# Patient Record
Sex: Female | Born: 1990 | Race: White | Hispanic: Yes | Marital: Single | Smoking: Never smoker
Health system: Southern US, Community
[De-identification: ages and names within clinical notes are randomized; demographics above are authoritative.]

## PROBLEM LIST (undated history)

## (undated) DIAGNOSIS — Z789 Other specified health status: Secondary | ICD-10-CM

## (undated) HISTORY — PX: NO PAST SURGERIES: SHX2092

---
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
## 2011-01-14 NOTE — Group Therapy Note (Signed)
NAME:  Natasha Gomez, Natasha Gomez NO.:  [id number]   MEDICAL RECORD NO.:  [id number]          PATIENT TYPE:  WOC   LOCATION:  [facility]                   FACILITY:  [facility]   PHYSICIAN:  Caren Griffins, CNM       DATE OF BIRTH:  [birth year]   DATE OF SERVICE:                                  CLINIC NOTE   REASON FOR VISIT:  Followup SAB.   HISTORY:  This is a 20 year old G1 P0-0-1-0 who was being followed in  maternity admissions for anembryonic pregnancy of 6 weeks one day sac  measurement, gestation on August 20.  She did receive Cytotec and  returned to the MAU on August 21 because of heavy bleeding.  At that  time, her hemoglobin was 13.7 and she was reassured.  She does have oral  contraceptives which she plans to start after her next spontaneous  normal period and she has not been sexually active.  She is still having  some pinkish bleeding and uses 4 or many pads a day.  She denies  cramping, denies passing any tissue, or feeling weak or dizzy.   INTERVAL HISTORY:  Since her visit to MAU on August 21, she has  continued to have this bleeding as described above and for this reason  pelvic exam was done.   PHYSICAL EXAMINATION:  VITAL SIGNS:  Pulse 106, BP 95/61, which is  compared to August 21 when her BP was 114/67 and pulse 82.  Her weight  is 125 and height 5 feet 1 inch.  ABDOMEN:  Soft, flat, nontender.  PELVIC:  NEFG.  UTERUS:  NSSP, minimally tender cervix, external loss fingertip,  internal close, long.  There is a scant amount of dark red mucousy blood  present.   ASSESSMENT:  Prolonged scant bleeding status post Cytotec termination 2  weeks ago.   PLAN:  Since gestational age [redacted] weeks, uterus normal size and bleeding is  scant, she will continue to monitor the bleeding and return here in 2  weeks if she still has continuous scanty bleeding.  If she has any heavy  bleeding, she will return at that time and if her bleeding ceases by 2  weeks, she will cancel  that appointment.  We again reviewed oral  contraceptive use, what to do if she miss a pill, and when to start  pills.  She is also advised about condom use to prevent STI's and about  the Gardasil which she had not heard of before.  Brochure was given.           ______________________________  Caren Griffins, CNM     DP/MEDQ  D:  05/03/2009  T:  05/04/2009  Job:  [id number]

## 2011-01-17 NOTE — H&P (Signed)
[name], [name]             ACCOUNT NO.:  [id number]   MEDICAL RECORD NO.:  [id number]          PATIENT TYPE:  INP   LOCATION:  9163                          FACILITY:  [facility]   PHYSICIAN:  Roseanna Rainbow, M.D.DATE OF BIRTH:  August 22, 1991   DATE OF ADMISSION:  12/25/2006  DATE OF DISCHARGE:                              HISTORY & PHYSICAL   CHIEF COMPLAINT:  The patient is a 20 year old gravida 1, para 0 with an  estimated date of confinement of April 20 with an intrauterine pregnancy  of 40 and 5/7 weeks for induction of labor secondary to a nonreactive  nonstress test.   HISTORY OF PRESENT ILLNESS:  Please see the above.   ALLERGIES:  No known drug allergies.   MEDICATIONS:  Prenatal vitamins.   OB RISK FACTORS:  Adolescent.  Urinary tract infection.  Prenatal  screening.  Urine culture and sensitivity on January 7:  E. coli, Pen  sensitive, negative test of cure on February 4.  Chlamydia probe  negative.  CG probe negative.  Hepatitis B surface antigen negative.  Pap smear negative.  Hematocrit 38.9, hemoglobin 13.3.  HIV nonreactive.  Platelets 210,000.  Blood type is O positive.  Antibody screen negative.  RPR nonreactive.  Rubella immune. Sickle cell negative.  Varicella  immune.   PAST GYN HISTORY:  Noncontributory.   PAST MEDICAL HISTORY:  No significant history of medical diseases.   PAST SURGICAL HISTORY:  No previous surgery.   SOCIAL HISTORY:  Does not give any significant history of alcohol usage.  Has no significant smoking history.  Denies illicit drug use.   FAMILY HISTORY:  Hypertension.   PHYSICAL EXAMINATION:  Vital signs:  Blood pressure 112/80, temperature  98, heart rate 96, respirations 20.  Fetal heart tracing reassuring.  Tocodynamometer contractions every 2-5 minutes.  Sterile vaginal exam.  Cervix is 3-4 cm dilated, 80% effaced.  The vertex at a 0 station.  The  membranes were artificially ruptured for clear fluid.   ASSESSMENT:   Primigravida at term, latent labor, fetal heart tracing  consistent with fetal well-being.   PLAN:  Admission, augmentation of labor.      Roseanna Rainbow, M.D.  Electronically Signed     LAJ/MEDQ  D:  12/25/2006  T:  12/25/2006  Job:  [id number]

## 2011-02-28 ENCOUNTER — Inpatient Hospital Stay (HOSPITAL_COMMUNITY)
Admission: AD | Admit: 2011-02-28 | Discharge: 2011-02-28 | Disposition: A | Discharge status: Home / Self Care | Payer: Self-pay | Source: Ambulatory Visit | Attending: Obstetrics & Gynecology | Admitting: Obstetrics & Gynecology

## 2011-02-28 DIAGNOSIS — O99891 Other specified diseases and conditions complicating pregnancy: Secondary | ICD-10-CM | POA: Insufficient documentation

## 2011-03-03 ENCOUNTER — Inpatient Hospital Stay (HOSPITAL_COMMUNITY)
Admission: AD | Admit: 2011-03-03 | Discharge: 2011-03-06 | DRG: 775 | Disposition: A | Discharge status: Home / Self Care | Payer: Medicaid Other | Source: Ambulatory Visit | Attending: Obstetrics & Gynecology | Admitting: Obstetrics & Gynecology

## 2011-03-03 LAB — CBC
Hemoglobin: 14.1 g/dL (ref 12.0–15.0)
MCH: 31.8 pg (ref 26.0–34.0)
RBC: 4.43 MIL/uL (ref 3.87–5.11)

## 2011-03-04 LAB — RPR: RPR Ser Ql: NONREACTIVE

## 2011-03-05 LAB — CBC
HCT: 34.6 % — ABNORMAL LOW (ref 36.0–46.0)
Hemoglobin: 11.6 g/dL — ABNORMAL LOW (ref 12.0–15.0)
MCHC: 33.5 g/dL (ref 30.0–36.0)
WBC: 11.5 10*3/uL — ABNORMAL HIGH (ref 4.0–10.5)

## 2011-05-27 ENCOUNTER — Emergency Department (HOSPITAL_COMMUNITY)
Admission: EM | Admit: 2011-05-27 | Discharge: 2011-05-27 | Disposition: A | Discharge status: Home / Self Care | Payer: Self-pay | Attending: Emergency Medicine | Admitting: Emergency Medicine

## 2011-05-27 ENCOUNTER — Emergency Department (HOSPITAL_COMMUNITY): Payer: Self-pay

## 2011-05-27 DIAGNOSIS — R1013 Epigastric pain: Secondary | ICD-10-CM | POA: Insufficient documentation

## 2011-05-27 DIAGNOSIS — K219 Gastro-esophageal reflux disease without esophagitis: Secondary | ICD-10-CM | POA: Insufficient documentation

## 2011-05-27 LAB — COMPREHENSIVE METABOLIC PANEL
ALT: 29 U/L (ref 0–35)
AST: 41 U/L — ABNORMAL HIGH (ref 0–37)
Albumin: 3.9 g/dL (ref 3.5–5.2)
Alkaline Phosphatase: 72 U/L (ref 39–117)
CO2: 26 mEq/L (ref 19–32)
Chloride: 106 mEq/L (ref 96–112)
GFR calc non Af Amer: >60 mL/min (ref >60)
Potassium: 3.7 mEq/L (ref 3.5–5.1)
Sodium: 140 mEq/L (ref 135–145)
Total Bilirubin: 0.4 mg/dL (ref 0.3–1.2)

## 2011-05-27 LAB — CBC
MCV: 89.7 fL (ref 78.0–100.0)
Platelets: 314 10*3/uL (ref 150–400)
RBC: 4.18 MIL/uL (ref 3.87–5.11)
RDW: 13.8 % (ref 11.5–15.5)
WBC: 11.4 10*3/uL — ABNORMAL HIGH (ref 4.0–10.5)

## 2011-05-27 LAB — URINE MICROSCOPIC-ADD ON

## 2011-05-27 LAB — DIFFERENTIAL
Basophils Absolute: 0 10*3/uL (ref 0.0–0.1)
Basophils Relative: 0 % (ref 0–1)
Eosinophils Absolute: 0.1 10*3/uL (ref 0.0–0.7)
Lymphs Abs: 2.4 10*3/uL (ref 0.7–4.0)
Neutrophils Relative %: 72 % (ref 43–77)

## 2011-05-27 LAB — URINALYSIS, ROUTINE W REFLEX MICROSCOPIC
Glucose, UA: NEGATIVE mg/dL
pH: 6 (ref 5.0–8.0)

## 2014-08-05 LAB — OB RESULTS CONSOLE RUBELLA ANTIBODY, IGM: Rubella: IMMUNE

## 2014-08-05 LAB — OB RESULTS CONSOLE GC/CHLAMYDIA
Chlamydia: NEGATIVE
Gonorrhea: NEGATIVE

## 2014-08-05 LAB — OB RESULTS CONSOLE HIV ANTIBODY (ROUTINE TESTING): HIV: NONREACTIVE

## 2014-08-05 LAB — OB RESULTS CONSOLE HEPATITIS B SURFACE ANTIGEN: Hepatitis B Surface Ag: NEGATIVE

## 2014-08-05 LAB — OB RESULTS CONSOLE RPR: RPR: NONREACTIVE

## 2014-08-07 ENCOUNTER — Other Ambulatory Visit (HOSPITAL_COMMUNITY): Payer: Self-pay | Admitting: Physician Assistant

## 2014-08-07 DIAGNOSIS — Z3689 Encounter for other specified antenatal screening: Secondary | ICD-10-CM

## 2014-09-01 NOTE — L&D Delivery Note (Signed)
Delivery Note Moderate MSF noted during second stage[facility] vigorously immediately after birth. Mouth and nose bulb suctioned. At 6:16 PM a viable female was delivered via Vaginal, Spontaneous Delivery (Presentation: Left Occiput Anterior).  APGAR: 9, 9; weight pending.    Placenta status: Intact, Spontaneous.  Cord:  with the following complications: None.  Cord pH: NA  Anesthesia: None  Episiotomy: None Lacerations: None Suture Repair: none Est. Blood Loss (mL): 425  Mom to postpartum.  Baby to Couplet care / Skin to Skin. Placenta to: BS Feeding: Breast Circ: NA Contraception: POP's  Natasha Gomez 01/30/2015, 7:30 PM

## 2014-09-11 ENCOUNTER — Ambulatory Visit (HOSPITAL_COMMUNITY)
Admission: RE | Admit: 2014-09-11 | Discharge: 2014-09-11 | Disposition: A | Discharge status: Home / Self Care | Payer: Medicaid Other | Source: Ambulatory Visit | Attending: Physician Assistant | Admitting: Physician Assistant

## 2014-09-11 ENCOUNTER — Encounter (HOSPITAL_COMMUNITY): Payer: Self-pay

## 2014-09-11 DIAGNOSIS — Z3689 Encounter for other specified antenatal screening: Secondary | ICD-10-CM | POA: Insufficient documentation

## 2014-09-11 DIAGNOSIS — Z3A19 19 weeks gestation of pregnancy: Secondary | ICD-10-CM | POA: Insufficient documentation

## 2014-09-11 DIAGNOSIS — Z36 Encounter for antenatal screening of mother: Secondary | ICD-10-CM | POA: Insufficient documentation

## 2014-10-20 ENCOUNTER — Other Ambulatory Visit (HOSPITAL_COMMUNITY): Payer: Self-pay | Admitting: Nurse Practitioner

## 2014-10-20 DIAGNOSIS — Z0372 Encounter for suspected placental problem ruled out: Secondary | ICD-10-CM

## 2014-10-20 DIAGNOSIS — Z0374 Encounter for suspected problem with fetal growth ruled out: Secondary | ICD-10-CM

## 2014-10-25 ENCOUNTER — Ambulatory Visit (HOSPITAL_COMMUNITY)
Admission: RE | Admit: 2014-10-25 | Discharge: 2014-10-25 | Disposition: A | Discharge status: Home / Self Care | Payer: Medicaid Other | Source: Ambulatory Visit | Attending: Nurse Practitioner | Admitting: Nurse Practitioner

## 2014-10-25 DIAGNOSIS — Z0374 Encounter for suspected problem with fetal growth ruled out: Secondary | ICD-10-CM | POA: Insufficient documentation

## 2014-10-25 DIAGNOSIS — Z0372 Encounter for suspected placental problem ruled out: Secondary | ICD-10-CM

## 2014-10-25 DIAGNOSIS — O43892 Other placental disorders, second trimester: Secondary | ICD-10-CM | POA: Insufficient documentation

## 2014-10-25 DIAGNOSIS — Z3A25 25 weeks gestation of pregnancy: Secondary | ICD-10-CM | POA: Insufficient documentation

## 2015-01-11 LAB — OB RESULTS CONSOLE GBS: STREP GROUP B AG: POSITIVE

## 2015-01-22 ENCOUNTER — Encounter (HOSPITAL_COMMUNITY): Payer: Self-pay

## 2015-01-22 ENCOUNTER — Inpatient Hospital Stay (HOSPITAL_COMMUNITY)
Admission: AD | Admit: 2015-01-22 | Discharge: 2015-01-22 | Disposition: A | Discharge status: Home / Self Care | Payer: Self-pay | Source: Ambulatory Visit | Attending: Obstetrics & Gynecology | Admitting: Obstetrics & Gynecology

## 2015-01-22 DIAGNOSIS — Z3A38 38 weeks gestation of pregnancy: Secondary | ICD-10-CM | POA: Insufficient documentation

## 2015-01-22 HISTORY — DX: Other specified health status: Z78.9

## 2015-01-22 NOTE — Discharge Instructions (Signed)
Fetal Movement Counts °Patient Name: __________________________________________________ Patient Due Date: ____________________ °Performing a fetal movement count is highly recommended in high-risk pregnancies, but it is good for every pregnant woman to do. Your health care provider may ask you to start counting fetal movements at 28 weeks of the pregnancy. Fetal movements often increase: °· After eating a full meal. °· After physical activity. °· After eating or drinking something sweet or cold. °· At rest. °Pay attention to when you feel the baby is most active. This will help you notice a pattern of your baby's sleep and wake cycles and what factors contribute to an increase in fetal movement. It is important to perform a fetal movement count at the same time each day when your baby is normally most active.  °HOW TO COUNT FETAL MOVEMENTS °1. Find a quiet and comfortable area to sit or lie down on your left side. Lying on your left side provides the best blood and oxygen circulation to your baby. °2. Write down the day and time on a sheet of paper or in a journal. °3. Start counting kicks, flutters, swishes, rolls, or jabs in a 2-hour period. You should feel at least 10 movements within 2 hours. °4. If you do not feel 10 movements in 2 hours, wait 2-3 hours and count again. Look for a change in the pattern or not enough counts in 2 hours. °SEEK MEDICAL CARE IF: °· You feel less than 10 counts in 2 hours, tried twice. °· There is no movement in over an hour. °· The pattern is changing or taking longer each day to reach 10 counts in 2 hours. °· You feel the baby is not moving as he or she usually does. °Date: ____________ Movements: ____________ Start time: ____________ Finish time: ____________  °Date: ____________ Movements: ____________ Start time: ____________ Finish time: ____________ °Date: ____________ Movements: ____________ Start time: ____________ Finish time: ____________ °Date: ____________ Movements:  ____________ Start time: ____________ Finish time: ____________ °Date: ____________ Movements: ____________ Start time: ____________ Finish time: ____________ °Date: ____________ Movements: ____________ Start time: ____________ Finish time: ____________ °Date: ____________ Movements: ____________ Start time: ____________ Finish time: ____________ °Date: ____________ Movements: ____________ Start time: ____________ Finish time: ____________  °Date: ____________ Movements: ____________ Start time: ____________ Finish time: ____________ °Date: ____________ Movements: ____________ Start time: ____________ Finish time: ____________ °Date: ____________ Movements: ____________ Start time: ____________ Finish time: ____________ °Date: ____________ Movements: ____________ Start time: ____________ Finish time: ____________ °Date: ____________ Movements: ____________ Start time: ____________ Finish time: ____________ °Date: ____________ Movements: ____________ Start time: ____________ Finish time: ____________ °Date: ____________ Movements: ____________ Start time: ____________ Finish time: ____________  °Date: ____________ Movements: ____________ Start time: ____________ Finish time: ____________ °Date: ____________ Movements: ____________ Start time: ____________ Finish time: ____________ °Date: ____________ Movements: ____________ Start time: ____________ Finish time: ____________ °Date: ____________ Movements: ____________ Start time: ____________ Finish time: ____________ °Date: ____________ Movements: ____________ Start time: ____________ Finish time: ____________ °Date: ____________ Movements: ____________ Start time: ____________ Finish time: ____________ °Date: ____________ Movements: ____________ Start time: ____________ Finish time: ____________  °Date: ____________ Movements: ____________ Start time: ____________ Finish time: ____________ °Date: ____________ Movements: ____________ Start time: ____________ Finish  time: ____________ °Date: ____________ Movements: ____________ Start time: ____________ Finish time: ____________ °Date: ____________ Movements: ____________ Start time: ____________ Finish time: ____________ °Date: ____________ Movements: ____________ Start time: ____________ Finish time: ____________ °Date: ____________ Movements: ____________ Start time: ____________ Finish time: ____________ °Date: ____________ Movements: ____________ Start time: ____________ Finish time: ____________  °Date: ____________ Movements: ____________ Start time: ____________ Finish   time: ____________ °Date: ____________ Movements: ____________ Start time: ____________ Finish time: ____________ °Date: ____________ Movements: ____________ Start time: ____________ Finish time: ____________ °Date: ____________ Movements: ____________ Start time: ____________ Finish time: ____________ °Date: ____________ Movements: ____________ Start time: ____________ Finish time: ____________ °Date: ____________ Movements: ____________ Start time: ____________ Finish time: ____________ °Date: ____________ Movements: ____________ Start time: ____________ Finish time: ____________  °Date: ____________ Movements: ____________ Start time: ____________ Finish time: ____________ °Date: ____________ Movements: ____________ Start time: ____________ Finish time: ____________ °Date: ____________ Movements: ____________ Start time: ____________ Finish time: ____________ °Date: ____________ Movements: ____________ Start time: ____________ Finish time: ____________ °Date: ____________ Movements: ____________ Start time: ____________ Finish time: ____________ °Date: ____________ Movements: ____________ Start time: ____________ Finish time: ____________ °Date: ____________ Movements: ____________ Start time: ____________ Finish time: ____________  °Date: ____________ Movements: ____________ Start time: ____________ Finish time: ____________ °Date: ____________  Movements: ____________ Start time: ____________ Finish time: ____________ °Date: ____________ Movements: ____________ Start time: ____________ Finish time: ____________ °Date: ____________ Movements: ____________ Start time: ____________ Finish time: ____________ °Date: ____________ Movements: ____________ Start time: ____________ Finish time: ____________ °Date: ____________ Movements: ____________ Start time: ____________ Finish time: ____________ °Date: ____________ Movements: ____________ Start time: ____________ Finish time: ____________  °Date: ____________ Movements: ____________ Start time: ____________ Finish time: ____________ °Date: ____________ Movements: ____________ Start time: ____________ Finish time: ____________ °Date: ____________ Movements: ____________ Start time: ____________ Finish time: ____________ °Date: ____________ Movements: ____________ Start time: ____________ Finish time: ____________ °Date: ____________ Movements: ____________ Start time: ____________ Finish time: ____________ °Date: ____________ Movements: ____________ Start time: ____________ Finish time: ____________ °Document Released: 09/17/2006 Document Revised: 01/02/2014 Document Reviewed: 06/14/2012 °Exit[employer]® Patient Information ©2015 [employer]. This information is not intended to replace advice given to you by your health care provider. Make sure you discuss any questions you have with your health care provider. °Vaginal Delivery °During delivery, your health care provider will help you give birth to your baby. During a vaginal delivery, you will work to push the baby out of your vagina. However, before you can push your baby out, a few things need to happen. The opening of your uterus (cervix) has to soften, thin out, and open up (dilate) all the way to 10 cm. Also, your baby has to move down from the uterus into your vagina.  °SIGNS OF LABOR  °Your health care provider will first need to make sure you are in labor.  Signs of labor include:  °· Passing what is called the mucous plug before labor begins. This is a small amount of blood-stained mucus. °· Having regular, painful uterine contractions.   °· The time between contractions gets shorter.   °· The discomfort and pain gradually get more intense. °· Contraction pains get worse when walking and do not go away when resting.   °· Your cervix becomes thinner (effacement) and dilates. °BEFORE THE DELIVERY °Once you are in labor and admitted into the hospital or care center, your health care provider may do the following:  °5. Perform a complete physical exam. °6. Review any complications related to pregnancy or labor.  °7. Check your blood pressure, pulse, temperature, and heart rate (vital signs).   °8. Determine if, and when, the rupture of amniotic membranes occurred. °9. Do a vaginal exam (using a sterile glove and lubricant) to determine:   °1. The position (presentation) of the baby. Is the baby's head presenting first (vertex) in the birth canal (vagina), or are the feet or buttocks first (breech)?   °2. The level (station) of the baby's head within the birth canal.   °3.   The effacement and dilatation of the cervix.   °10. An electronic fetal monitor is usually placed on your abdomen when you first arrive. This is used to monitor your contractions and the baby's heart rate. °1. When the monitor is on your abdomen (external fetal monitor), it can only pick up the frequency and length of your contractions. It cannot tell the strength of your contractions. °2. If it becomes necessary for your health care provider to know exactly how strong your contractions are or to see exactly what the baby's heart rate is doing, an internal monitor may be inserted into your vagina and uterus. Your health care provider will discuss the benefits and risks of using an internal monitor and obtain your permission before inserting the device. °3. Continuous fetal monitoring may be needed if you  have an epidural, are receiving certain medicines (such as oxytocin), or have pregnancy or labor complications. °11. An IV access tube may be placed into a vein in your arm to deliver fluids and medicines if necessary. °THREE STAGES OF LABOR AND DELIVERY °Normal labor and delivery is divided into three stages. °First Stage °This stage starts when you begin to contract regularly and your cervix begins to efface and dilate. It ends when your cervix is completely open (fully dilated). The first stage is the longest stage of labor and can last from 3 hours to 15 hours.  °Several methods are available to help with labor pain. You and your health care provider will decide which option is best for you. Options include:  °· Opioid medicines. These are strong pain medicines that you can get through your IV tube or as a shot into your muscle. These medicines lessen pain but do not make it go away completely.  °· Epidural. A medicine is given through a thin tube that is inserted in your back. The medicine numbs the lower part of your body and prevents any pain in that area. °· Paracervical pain medicine. This is an injection of an anesthetic on each side of your cervix.   °· You may request natural childbirth, which does not involve the use of pain medicines or an epidural during labor and delivery. Instead, you will use other things, such as breathing exercises, to help cope with the pain. °Second Stage °The second stage of labor begins when your cervix is fully dilated at 10 cm. It continues until you push your baby down through the birth canal and the baby is born. This stage can take only minutes or several hours. °· The location of your baby's head as it moves through the birth canal is reported as a number called a station. If the baby's head has not started its descent, the station is described as being at minus 3 (-3). When your baby's head is at the zero station, it is at the middle of the birth canal and is engaged  in the pelvis. The station of your baby helps indicate the progress of the second stage of labor. °· When your baby is born, your health care provider may hold the baby with his or her head lowered to prevent amniotic fluid, mucus, and blood from getting into the baby's lungs. The baby's mouth and nose may be suctioned with a small bulb syringe to remove any additional fluid. °· Your health care provider may then place the baby on your stomach. It is important to keep the baby from getting cold. To do this, the health care provider will dry the baby off, place the   baby directly on your skin (with no blankets between you and the baby), and cover the baby with warm, dry blankets.   °· The umbilical cord is cut. °Third Stage °During the third stage of labor, your health care provider will deliver the placenta (afterbirth) and make sure your bleeding is under control. The delivery of the placenta usually takes about 5 minutes but can take up to 30 minutes. After the placenta is delivered, a medicine may be given either by IV or injection to help contract the uterus and control bleeding. If you are planning to breastfeed, you can try to do so now. °After you deliver the placenta, your uterus should contract and get very firm. If your uterus does not remain firm, your health care provider will massage it. This is important because the contraction of the uterus helps cut off bleeding at the site where the placenta was attached to your uterus. If your uterus does not contract properly and stay firm, you may continue to bleed heavily. If there is a lot of bleeding, medicines may be given to contract the uterus and stop the bleeding.  °Document Released: 05/27/2008 Document Revised: 01/02/2014 Document Reviewed: 02/06/2013 °ExitCare® Patient Information ©2015 [employer]. This information is not intended to replace advice given to you by your health care provider. Make sure you discuss any questions you have with your  health care provider. ° °

## 2015-01-22 NOTE — Progress Notes (Signed)
No loss of fluid just increased cervical discharge.  Cervical exam by nurse with patient still closed, thick and high. Contractions >1110mion and irregular.  Reviewed strip with Cat 1 tracing; reassuring.  Plan for discharge by MAU nurse. Patient given instructions on when to return.  Caryl AdaJazma Antha Niday, DO 01/22/2015, 10:36 AM PGY-1, [facility]

## 2015-01-22 NOTE — MAU Note (Signed)
Feeling a lot of pressure in lower abd and on rectum.  Small gush of fluid on Sat morning.  Has been feeling little trickle keeps coming, underwear is always wet since then.  Has been feeling what feels like diarrhea cramps. Is not having diarrhea, but has had more frequent, soft stools.

## 2015-01-30 ENCOUNTER — Encounter (HOSPITAL_COMMUNITY): Payer: Self-pay | Admitting: *Deleted

## 2015-01-30 ENCOUNTER — Inpatient Hospital Stay (HOSPITAL_COMMUNITY)
Admission: AD | Admit: 2015-01-30 | Discharge: 2015-02-01 | DRG: 775 | Disposition: A | Discharge status: Home / Self Care | Payer: Medicaid Other | Source: Ambulatory Visit | Attending: Family Medicine | Admitting: Family Medicine

## 2015-01-30 DIAGNOSIS — Z3483 Encounter for supervision of other normal pregnancy, third trimester: Secondary | ICD-10-CM | POA: Diagnosis present

## 2015-01-30 DIAGNOSIS — O99820 Streptococcus B carrier state complicating pregnancy: Secondary | ICD-10-CM

## 2015-01-30 DIAGNOSIS — O99824 Streptococcus B carrier state complicating childbirth: Principal | ICD-10-CM | POA: Diagnosis present

## 2015-01-30 DIAGNOSIS — IMO0001 Reserved for inherently not codable concepts without codable children: Secondary | ICD-10-CM

## 2015-01-30 DIAGNOSIS — Z3A39 39 weeks gestation of pregnancy: Secondary | ICD-10-CM | POA: Diagnosis present

## 2015-01-30 LAB — CBC
HEMATOCRIT: 42.7 % (ref 36.0–46.0)
Hemoglobin: 15.2 g/dL — ABNORMAL HIGH (ref 12.0–15.0)
MCH: 33 pg (ref 26.0–34.0)
MCHC: 35.6 g/dL (ref 30.0–36.0)
MCV: 92.6 fL (ref 78.0–100.0)
Platelets: 221 10*3/uL (ref 150–400)
RBC: 4.61 MIL/uL (ref 3.87–5.11)
RDW: 13.8 % (ref 11.5–15.5)
WBC: 12.2 10*3/uL — AB (ref 4.0–10.5)

## 2015-01-30 LAB — TYPE AND SCREEN
ABO/RH(D): O POS
Antibody Screen: NEGATIVE

## 2015-01-30 MED ORDER — ACETAMINOPHEN 325 MG PO TABS
650.0000 mg | ORAL_TABLET | ORAL | Status: DC | PRN
Start: 2015-01-30 — End: 2015-02-01

## 2015-01-30 MED ORDER — LANOLIN HYDROUS EX OINT: 1.0000 "application " | TOPICAL_OINTMENT | CUTANEOUS | Status: DC | PRN

## 2015-01-30 MED ORDER — ONDANSETRON HCL 4 MG/2ML IJ SOLN: 4.0000 mg | Freq: Four times a day (QID) | INTRAMUSCULAR | Status: DC | PRN

## 2015-01-30 MED ORDER — SIMETHICONE 80 MG PO CHEW: 80.0000 mg | CHEWABLE_TABLET | ORAL | Status: DC | PRN

## 2015-01-30 MED ORDER — DIBUCAINE 1 % RE OINT: 1.0000 "application " | TOPICAL_OINTMENT | RECTAL | Status: DC | PRN

## 2015-01-30 MED ORDER — WITCH HAZEL-GLYCERIN EX PADS: 1.0000 "application " | MEDICATED_PAD | CUTANEOUS | Status: DC | PRN

## 2015-01-30 MED ORDER — OXYCODONE-ACETAMINOPHEN 5-325 MG PO TABS: 2.0000 | ORAL_TABLET | ORAL | Status: DC | PRN

## 2015-01-30 MED ORDER — FERROUS SULFATE 325 (65 FE) MG PO TABS
325.0000 mg | ORAL_TABLET | Freq: Two times a day (BID) | ORAL | Status: DC
  Administered 2015-01-31 – 2015-02-01 (×3): 325 mg via ORAL
  Filled 2015-01-30 (×4): qty 1

## 2015-01-30 MED ORDER — ONDANSETRON HCL 4 MG PO TABS: 4.0000 mg | ORAL_TABLET | ORAL | Status: DC | PRN

## 2015-01-30 MED ORDER — OXYTOCIN 40 UNITS IN LACTATED RINGERS INFUSION - SIMPLE MED
62.5000 mL/h | INTRAVENOUS | Status: DC
Start: 2015-01-30 — End: 2015-01-30
  Administered 2015-01-30: 62.5 mL/h via INTRAVENOUS
  Filled 2015-01-30: qty 1000

## 2015-01-30 MED ORDER — CITRIC ACID-SODIUM CITRATE 334-500 MG/5ML PO SOLN: 30.0000 mL | ORAL | Status: DC | PRN

## 2015-01-30 MED ORDER — PENICILLIN G POTASSIUM 5000000 UNITS IJ SOLR
5.0000 10*6.[IU] | Freq: Once | INTRAVENOUS | Status: AC
  Administered 2015-01-30: 5 10*6.[IU] via INTRAVENOUS
  Filled 2015-01-30: qty 5

## 2015-01-30 MED ORDER — MISOPROSTOL 200 MCG PO TABS
1000.0000 ug | ORAL_TABLET | Freq: Once | ORAL | Status: AC
  Administered 2015-01-30: 1000 ug via RECTAL

## 2015-01-30 MED ORDER — OXYTOCIN BOLUS FROM INFUSION: 500.0000 mL | INTRAVENOUS | Status: DC

## 2015-01-30 MED ORDER — DIPHENHYDRAMINE HCL 25 MG PO CAPS: 25.0000 mg | ORAL_CAPSULE | Freq: Four times a day (QID) | ORAL | Status: DC | PRN

## 2015-01-30 MED ORDER — PRENATAL MULTIVITAMIN CH
1.0000 | ORAL_TABLET | Freq: Every day | ORAL | Status: DC
  Administered 2015-01-31 – 2015-02-01 (×2): 1 via ORAL
  Filled 2015-01-30 (×2): qty 1

## 2015-01-30 MED ORDER — FLEET ENEMA 7-19 GM/118ML RE ENEM: 1.0000 | ENEMA | RECTAL | Status: DC | PRN

## 2015-01-30 MED ORDER — LACTATED RINGERS IV SOLN: 500.0000 mL | INTRAVENOUS | Status: DC | PRN

## 2015-01-30 MED ORDER — ZOLPIDEM TARTRATE 5 MG PO TABS: 5.0000 mg | ORAL_TABLET | Freq: Every evening | ORAL | Status: DC | PRN

## 2015-01-30 MED ORDER — PENICILLIN G POTASSIUM 5000000 UNITS IJ SOLR
2.5000 10*6.[IU] | INTRAVENOUS | Status: DC
  Filled 2015-01-30 (×3): qty 2.5

## 2015-01-30 MED ORDER — SENNOSIDES-DOCUSATE SODIUM 8.6-50 MG PO TABS
2.0000 | ORAL_TABLET | ORAL | Status: DC
  Administered 2015-01-30: 2 via ORAL
  Filled 2015-01-30 (×2): qty 2

## 2015-01-30 MED ORDER — TETANUS-DIPHTH-ACELL PERTUSSIS 5-2.5-18.5 LF-MCG/0.5 IM SUSP: 0.5000 mL | Freq: Once | INTRAMUSCULAR | Status: DC

## 2015-01-30 MED ORDER — ACETAMINOPHEN 325 MG PO TABS: 650.0000 mg | ORAL_TABLET | ORAL | Status: DC | PRN

## 2015-01-30 MED ORDER — IBUPROFEN 600 MG PO TABS
600.0000 mg | ORAL_TABLET | Freq: Four times a day (QID) | ORAL | Status: DC
  Administered 2015-01-31 – 2015-02-01 (×7): 600 mg via ORAL
  Filled 2015-01-30 (×8): qty 1

## 2015-01-30 MED ORDER — MAGNESIUM HYDROXIDE 400 MG/5ML PO SUSP
30.0000 mL | ORAL | Status: DC | PRN
Start: 2015-01-30 — End: 2015-02-01

## 2015-01-30 MED ORDER — FENTANYL CITRATE (PF) 100 MCG/2ML IJ SOLN
50.0000 ug | INTRAMUSCULAR | Status: DC | PRN
  Administered 2015-01-30: 100 ug via INTRAVENOUS
  Filled 2015-01-30: qty 2

## 2015-01-30 MED ORDER — METHYLERGONOVINE MALEATE 0.2 MG/ML IJ SOLN: 0.2000 mg | INTRAMUSCULAR | Status: DC | PRN

## 2015-01-30 MED ORDER — METHYLERGONOVINE MALEATE 0.2 MG PO TABS: 0.2000 mg | ORAL_TABLET | ORAL | Status: DC | PRN

## 2015-01-30 MED ORDER — MISOPROSTOL 200 MCG PO TABS
ORAL_TABLET | ORAL | Status: AC
  Filled 2015-01-30: qty 5

## 2015-01-30 MED ORDER — OXYCODONE-ACETAMINOPHEN 5-325 MG PO TABS
1.0000 | ORAL_TABLET | ORAL | Status: DC | PRN
Start: 2015-01-30 — End: 2015-02-01
  Administered 2015-01-31: 1 via ORAL
  Filled 2015-01-30: qty 1

## 2015-01-30 MED ORDER — OXYCODONE-ACETAMINOPHEN 5-325 MG PO TABS
1.0000 | ORAL_TABLET | ORAL | Status: DC | PRN
  Administered 2015-01-30: 1 via ORAL
  Filled 2015-01-30: qty 1

## 2015-01-30 MED ORDER — LIDOCAINE HCL (PF) 1 % IJ SOLN
30.0000 mL | INTRAMUSCULAR | Status: DC | PRN
  Filled 2015-01-30: qty 30

## 2015-01-30 MED ORDER — BENZOCAINE-MENTHOL 20-0.5 % EX AERO: 1.0000 "application " | INHALATION_SPRAY | CUTANEOUS | Status: DC | PRN

## 2015-01-30 MED ORDER — LACTATED RINGERS IV SOLN
INTRAVENOUS | Status: DC
  Administered 2015-01-30: 18:00:00 via INTRAVENOUS

## 2015-01-30 MED ORDER — MEASLES, MUMPS & RUBELLA VAC ~~LOC~~ INJ: 0.5000 mL | INJECTION | Freq: Once | SUBCUTANEOUS | Status: DC

## 2015-01-30 MED ORDER — ONDANSETRON HCL 4 MG/2ML IJ SOLN: 4.0000 mg | INTRAMUSCULAR | Status: DC | PRN

## 2015-01-30 NOTE — MAU Note (Signed)
Been contracting since around 0600, now every 3 to 4 min.  Small amt of bleeding.  Feels wet when has contraction, ? Pee.

## 2015-01-30 NOTE — Consult Note (Signed)
Called to attend this vaginal delivery for [facility]SAF.  Infant came out crying so NICU delivery team was dismissed by Ivonne AndrewV. Smith, CNM.    Overton MamMary Ann T Shannette Tabares, MD (Attending Neonatologist)

## 2015-01-30 NOTE — Progress Notes (Signed)
Delivery of live viable female by Ivonne AndrewV. Smith, CNM APGARS [phone]

## 2015-01-30 NOTE — H&P (Signed)
HPI: Natasha Gomez is a 24 y.o. year old 533P2002 female at 7135w2d weeks gestation who presents to [facility] reporting Labor. [facility].    History OB History    Gravida Para Term Preterm AB TAB SAB Ectopic Multiple Living   3 2 2       2      Past Medical History  Diagnosis Date  . Medical history non-contributory    Past Surgical History  Procedure Laterality Date  . No past surgeries     Family History: family history is not on file. Social History:  reports that she has never smoked. She does not have any smokeless tobacco history on file. She reports that she does not drink alcohol or use illicit drugs.   Prenatal Transfer Tool  Maternal Diabetes: No Genetic Screening: Normal Maternal Ultrasounds/Referrals: Abnormal:  Findings:   Other: Placental mass not thought to be clinically significant.  Fetal Ultrasounds or other Referrals:  None Maternal Substance Abuse:  No Significant Maternal Medications:  None Significant Maternal Lab Results:  Lab values include: Group B Strep positive Other Comments:  Placental mass not thought to be clinically significant.   Review of Systems  Constitutional: Negative for fever.  Eyes: Negative for blurred vision.  Gastrointestinal: Positive for abdominal pain (Contractions only).  Neurological: Negative for headaches.    Dilation: 4 Effacement (%): 80 Station: -2 Exam by:: Dorrene GermanJ. Lowe RN Blood pressure 106/68, pulse 89, temperature 98 F (36.7 C), temperature source Oral, resp. rate 18. Maternal Exam:  Uterine Assessment: Contraction strength is firm.  Contraction frequency is regular.   Abdomen: Patient reports no abdominal tenderness. Fundal height is AGA.   Fetal presentation: vertex  Introitus: Normal vulva. Normal vagina.  Pelvis: adequate for delivery.   Cervix: Cervix evaluated by digital exam.     Fetal Exam Fetal Monitor Review: Mode: ultrasound.   Baseline rate: 135.  Variability: moderate  (6-25 bpm).   Pattern: accelerations present and no decelerations.    Fetal State Assessment: Category I - tracings are normal.     Physical Exam  Nursing note and vitals reviewed. Constitutional: She is oriented to person, place, and time. She appears well-developed and well-nourished.  HENT:  Head: Normocephalic.  Eyes: Conjunctivae are normal.  Cardiovascular: Normal rate, regular rhythm and normal heart sounds.   Respiratory: Effort normal and breath sounds normal.  GI: There is no tenderness.  Genitourinary: Vagina normal.  Musculoskeletal: She exhibits no edema or tenderness.  Neurological: She is alert and oriented to person, place, and time.  Skin: Skin is warm and dry.  Psychiatric: She has a normal mood and affect.    Prenatal labs: ABO, Rh:  O Pos Antibody:  Neg Rubella:  Immune RPR:   NR HBsAg:   Neg HIV:   NR GBS: Positive (05/12 0000)  1 hour GTT 140. 3 hour GTT normal 67/126/118/102 CF neg AFP neg  Assessment: 1. Labor: Early 2. Fetal Wellbeing: Category I  3. Pain Control: None 4. GBS: Pos 5. 39.2 week IUP  Plan:  1. Admit to BS per consult with MD 2. Routine L&D orders 3. Analgesia/anesthesia PRN  4. PCN  Natasha Gomez 01/30/2015, 4:40 PM

## 2015-01-31 ENCOUNTER — Encounter (HOSPITAL_COMMUNITY): Payer: Self-pay | Admitting: Advanced Practice Midwife

## 2015-01-31 LAB — CBC
HEMATOCRIT: 38.7 % (ref 36.0–46.0)
HEMOGLOBIN: 13.4 g/dL (ref 12.0–15.0)
MCH: 31.8 pg (ref 26.0–34.0)
MCHC: 34.6 g/dL (ref 30.0–36.0)
MCV: 91.7 fL (ref 78.0–100.0)
PLATELETS: 222 10*3/uL (ref 150–400)
RBC: 4.22 MIL/uL (ref 3.87–5.11)
RDW: 13.7 % (ref 11.5–15.5)
WBC: 12.7 10*3/uL — AB (ref 4.0–10.5)

## 2015-01-31 LAB — RPR: RPR Ser Ql: NONREACTIVE

## 2015-01-31 NOTE — Progress Notes (Signed)
  Post Partum Day 1 Subjective:  Natasha Gomez is a 24 y.o. G3P3003 4340w2d s/p SVD. No acute events overnight. Pt denies problems with ambulating, voiding or po intake. She denies nausea or vomiting. Pain is well controlled. She has had flatus. She has had a bowel movement. Lochia Small. Plan for birth control is oral progesterone-only contraceptive. Method of Feeding: Breast  Objective: Blood pressure 92/60, pulse 74, temperature 97.6 F (36.4 C), temperature source Oral, resp. rate 16, height 5\' 2"  (1.575 m), weight 74.844 kg (165 lb), unknown if currently breastfeeding.  Physical Exam:  General: alert, cooperative and no distress Lochia: normal flow Chest: CTAB Heart: RRR no m/r/g Abdomen: +BS, soft, nontender Uterine Fundus: firm, palpable 1 finger below the umbilicus DVT Evaluation: No evidence of DVT seen on physical exam. Extremities: no edema   Recent Labs (last 2 labs)      Recent Labs  01/30/15 1630 01/31/15 0525  HGB 15.2* 13.4  HCT 42.7 38.7      Assessment/Plan:  ASSESSMENT: Natasha Gomez is a 24 y.o. G3P3003 4440w2d s/p SVD. Patient is recovering well.  Plan for discharge tomorrow   LOS: 1 day      [place] Shandon [place], [place] 01/31/2015 9:39 AM

## 2015-02-01 DIAGNOSIS — O99824 Streptococcus B carrier state complicating childbirth: Secondary | ICD-10-CM

## 2015-02-01 DIAGNOSIS — Z3A39 39 weeks gestation of pregnancy: Secondary | ICD-10-CM

## 2015-02-01 MED ORDER — IBUPROFEN 600 MG PO TABS: 600.0000 mg | ORAL_TABLET | Freq: Four times a day (QID) | ORAL | Status: AC | PRN

## 2015-02-01 MED ORDER — NORETHINDRONE 0.35 MG PO TABS: 1.0000 | ORAL_TABLET | Freq: Every day | ORAL | Status: AC

## 2015-02-01 NOTE — Progress Notes (Signed)
UR chart review completed.  

## 2015-02-01 NOTE — Lactation Note (Signed)
This note was copied from the chart of Girl Roque CashKaren Duignan. Lactation Consultation Note  Patient Name: Girl Roque CashKaren Trainer ZOXWR'UToday's Date: 02/01/2015 Reason for consult: Follow-up assessment  Baby is 7847 hours old been to the breast consistently - 10 -60 mins , Latch score range 7-10  Baby is only at 4% weight loss, Bili check at 33 hrs. 7.1 , Voiding QS, stools x3 ( Updated by LC per mom )  Last  Fed at 443p for 20 mins and wet diaper per mom. Per mom breast are feeling fuller and initially with latch  Nipples feed tender but resolves quickly. LC recommended to mom to rotate at least between 2 positions. LC reviewed prevention of sore nipple and engorgement . Per mom had mastitis x1 .  Mother informed of post-discharge support and given phone number to the lactation department, including services for phone  call assistance; out-patient appointments; and breastfeeding support group. List of other breastfeeding resources in the community  given in the handout. Encouraged mother to call for problems or concerns related to breastfeeding.   Maternal Data    Feeding Feeding Type:  (per mom baby recently breast fed ) Length of feed: 20 min (per mom )  LATCH Score/Interventions                Intervention(s): Breastfeeding basics reviewed     Lactation Tools Discussed/Used     Consult Status Consult Status: Complete Date: 02/01/15    Kathrin Greathouseorio, Shirlean Berman Ann 02/01/2015, 5:46 PM

## 2015-02-01 NOTE — Discharge Summary (Signed)
Obstetric Discharge Summary Reason for Admission: onset of labor Prenatal Procedures: ultrasound - placental mass not thought to be clinically significant Intrapartum Procedures: spontaneous vaginal delivery Postpartum Procedures: none Complications-Operative and Postpartum: none  Delivery Note 01/30/15 "Moderate MSF noted during second stage[facility] but baby cried vigorously immediately after birth. Mouth and nose bulb suctioned. At 6:16 PM a viable female was delivered via Vaginal, Spontaneous Delivery (Presentation: Left Occiput Anterior). APGAR: 9, 9; weight pending.  Placenta status: Intact, Spontaneous. Cord: with the following complications: None. Cord pH: NA  Anesthesia: None  Episiotomy: None Lacerations: None Suture Repair: none Est. Blood Loss (mL): 425  Mom to postpartum. Baby to Couplet care / Skin to Skin. Placenta to: BS Feeding: Breast Circ: NA Contraception: POP's"  Hospital Course:  Active Problems:   Active labor   Vaginal delivery   Natasha Gomez is a 24 y.o. G3P3003 s/p spontaneous vaginal delivery.  Patient was admitted on 01/30/15 in labor.  She has postpartum course that was uncomplicated including no problems with ambulating, PO intake, and urination. The pt feels ready to go home and  will be discharged with outpatient follow-up.   Today: No acute events overnight.  Pt denies problems with ambulating, voiding or po intake.  She denies nausea or vomiting.  Pain is well controlled with Motrin.  She has had flatus. She has had bowel movement.  Lochia Small.  Plan for birth control is  oral progesterone-only contraceptive.  Method of Feeding: Breast.   Baby girl needs to stay 48hrs (until 6pm) for observation due to inadequate GBS.  Physical Exam:  General: alert, cooperative, appears stated age and no distress Lochia: appropriate Uterine Fundus: firm DVT Evaluation: No evidence of DVT seen on physical exam. Negative  Homan's sign. No cords or calf tenderness. No significant calf/ankle edema.  H/H: Lab Results  Component Value Date/Time   HGB 13.4 01/31/2015 05:25 AM   HCT 38.7 01/31/2015 05:25 AM    Discharge Diagnoses: Term Pregnancy-delivered  Discharge Information: Date: 02/01/2015 Activity: pelvic rest Diet: routine  Medications: PNV, Ibuprofen and Micronor to start in 4 weeks Breast feeding:  Yes Condition: stable Instructions: refer to handout Discharge to: home      Medication List    ASK your doctor about these medications        calcium carbonate 500 MG chewable tablet  Commonly known as:  TUMS - dosed in mg elemental calcium  Chew 1-2 tablets by mouth at bedtime as needed for indigestion or heartburn (depends on indigesion if patient takes 1 or 2 tablets).     prenatal multivitamin Tabs tablet  Take 1 tablet by mouth daily at 12 noon.         Jingpeng He Medical Student 02/01/2015,7:49 AM   CNM attestation I have seen and examined this patient.  Natasha Gomez is a 24 y.o. G3P3003 s/p SVD.   Pain is well controlled.  Plan for birth control is oral progesterone-only contraceptive.  Method of Feeding: breast  PE:  BP 95/59 mmHg  Pulse 69  Temp(Src) 98.1 F (36.7 C) (Oral)  Resp 16  Ht 5\' 2"  (1.575 m)  Wt 74.844 kg (165 lb)  BMI 30.17 kg/m2  Breastfeeding? Unknown Fundus firm   Recent Labs  01/30/15 1630 01/31/15 0525  HGB 15.2* 13.4  HCT 42.7 38.7     Plan: discharge today - postpartum care discussed - f/u clinic in 6 weeks for postpartum visit   Ouita Nish, CNM 9:12 AM

## 2015-02-01 NOTE — Discharge Instructions (Signed)

## 2015-02-01 NOTE — Lactation Note (Signed)
This note was copied from the chart of Natasha Roque CashKaren Drew. Lactation Consultation Note Experienced BF mom of her other children for 3 months. Plans to BF this baby longer. Has issues w/mastitis last time w/her 4 yr. Old. Discussed prevention and causes of mastitis. Denies painful latches, states baby is cluster feeding. Had baby swaddled and had just finished BF. Encouraged STS. Hand expression demonstrated.\ Has good everted nipples. Encouraged monitoring I&O and breast massage during BF. Mom encouraged to feed baby 8-12 times/24 hours and with feeding cues. Mom encouraged to waken baby for feeds.  Educated about newborn behavior.  Referred to Baby and Me Book in Breastfeeding section Pg. 22-23 for position options and Proper latch demonstration.[facility]/LC brochure given w/resources, support groups and LC services. Patient Name: Natasha Gomez Today's Date: 02/01/2015 Reason for consult: Initial assessment   Maternal Data Has patient been taught Hand Expression?: Yes Does the patient have breastfeeding experience prior to this delivery?: Yes  Feeding    LATCH Score/Interventions    Intervention(s): Hand expression  Type of Nipple: Everted at rest and after stimulation  Comfort (Breast/Nipple): Soft / non-tender     Intervention(s): Breastfeeding basics reviewed;Support Pillows;Position options;Skin to skin     Lactation Tools Discussed/Used WIC Program: Yes   Consult Status Consult Status: Complete Date: 02/01/15 Follow-up type: In-patient    Kenyah Luba, Diamond NickelLAURA G 02/01/2015, 5:00 AM

## 2015-12-04 IMAGING — US US OB FOLLOW-UP
1 series · 12 of 28 positions shown · non-contrast
Comparison: none

[Series 1: us ob follow up · 57 acquisitions, 12 frames shown]
[im 3/57]
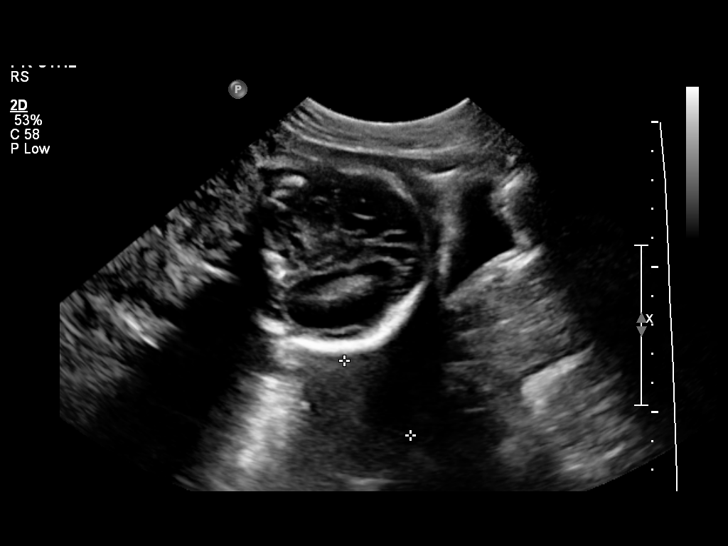
[im 7/57]
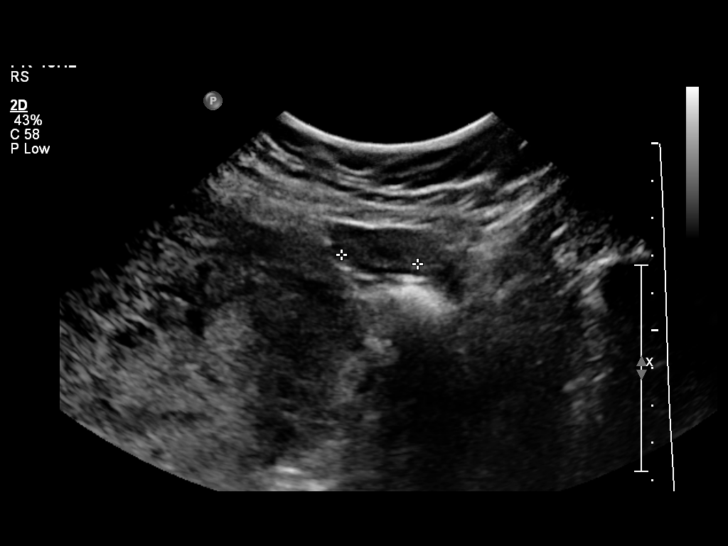
[im 11/57]
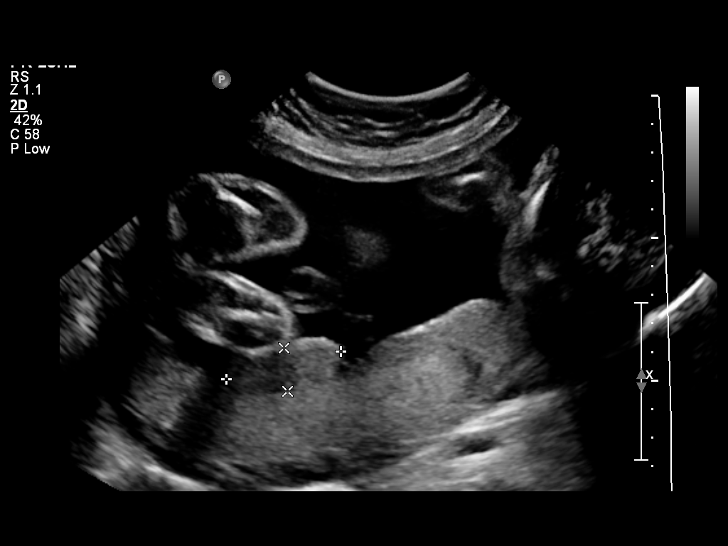
[im 17/57]
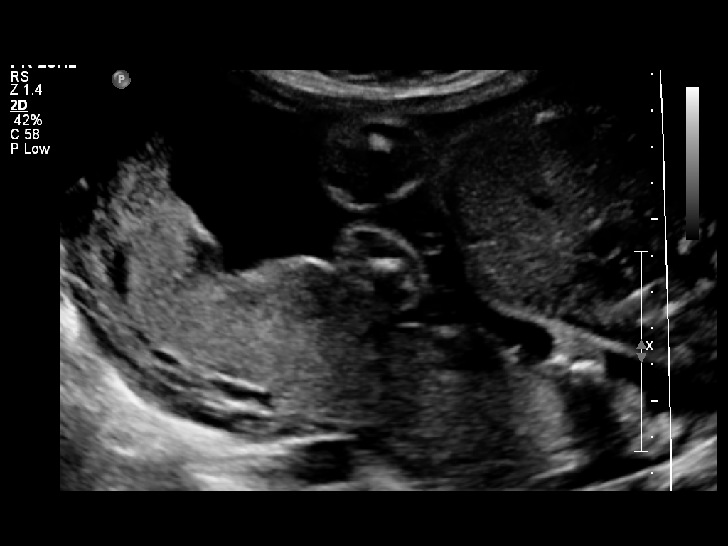
[im 21/57]
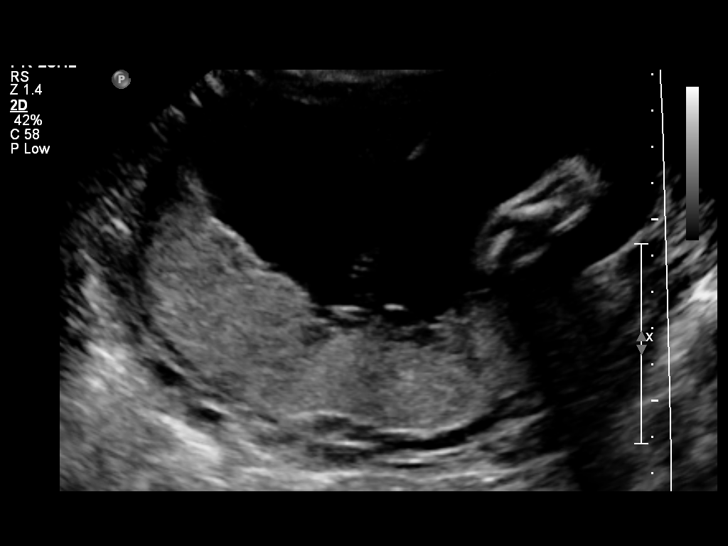
[im 25/57]
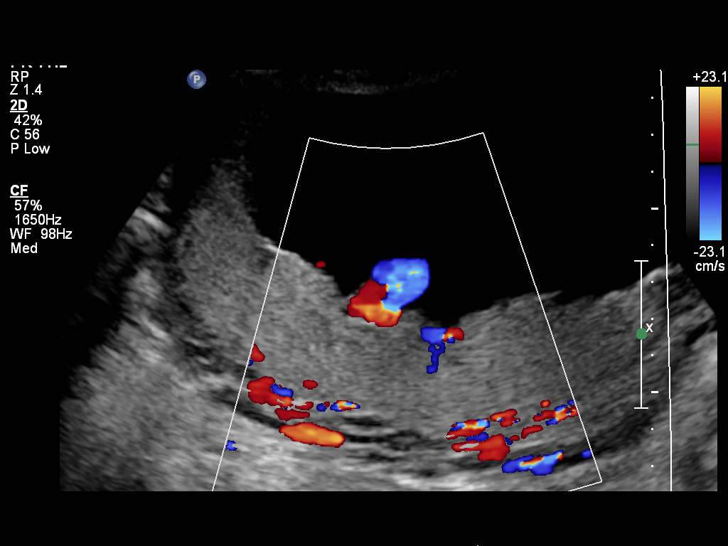
[im 32/57]
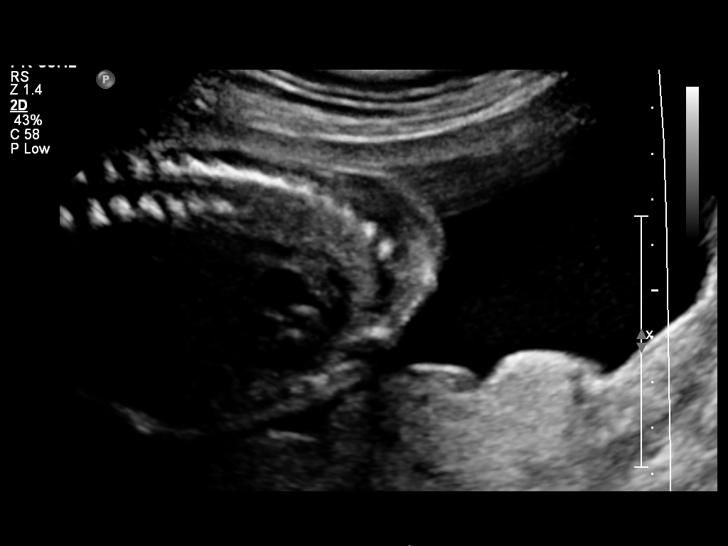
[im 36/57]
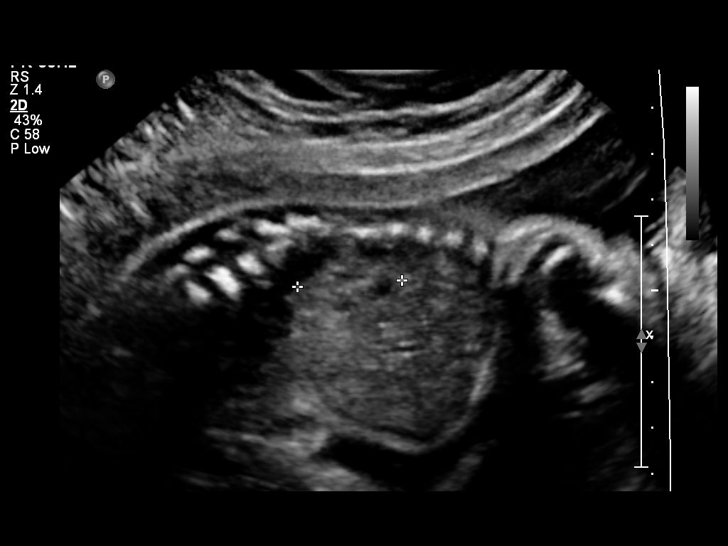
[im 40/57]
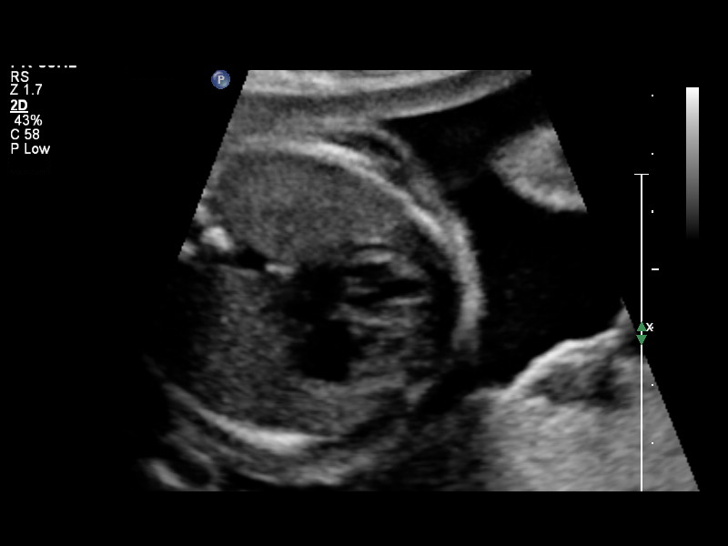
[im 46/57]
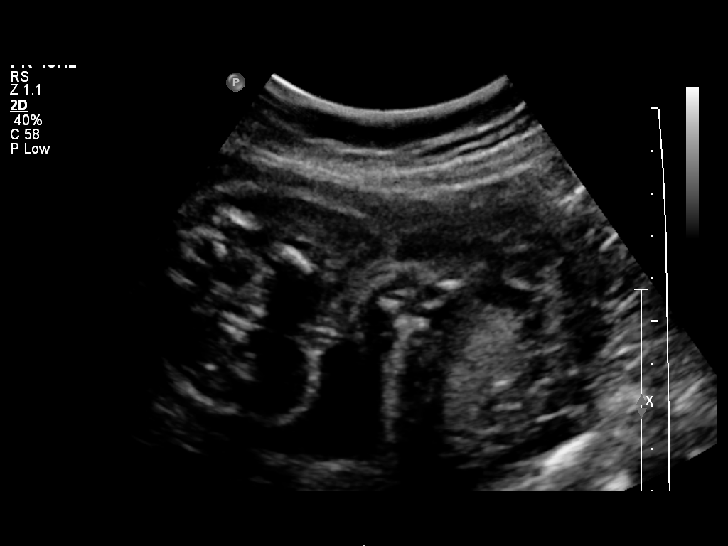
[im 50/57]
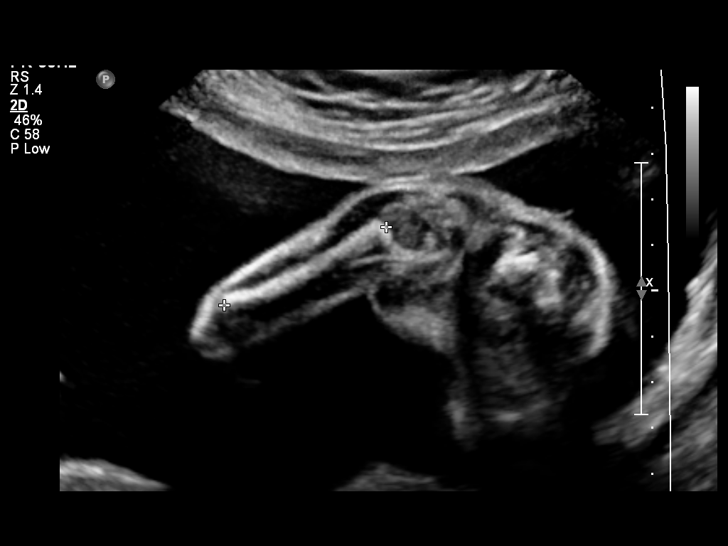
[im 54/57]
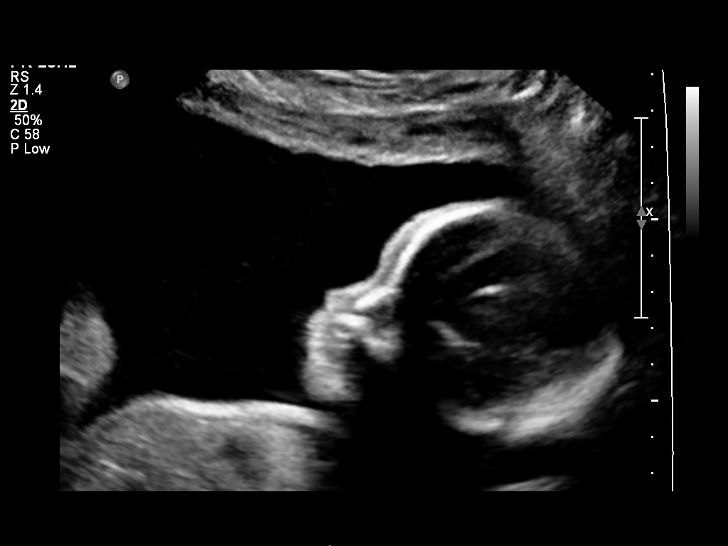

[12 of 28 positions shown; findings below may reference images not displayed]

OBSTETRICS REPORT
                      (Signed Final 10/25/2014 [DATE])

Service(s) Provided

 US OB FOLLOW UP                                       76816.1
Indications

 25 weeks gestation of pregnancy
 Follow up placental mass
Fetal Evaluation

 Num Of Fetuses:    1
 Fetal Heart Rate:  144                          bpm
 Cardiac Activity:  Observed
 Presentation:      Cephalic
 Placenta:          Posterior, above cervical
                    os
 P. Cord            Visualized
 Insertion:

 Amniotic Fluid
 AFI FV:      Subjectively within normal limits
                                             Larg Pckt:     6.5  cm
Biometry

 BPD:     65.8  mm     G. Age:  26w 4d                CI:        75.52   70 - 86
                                                      FL/HC:      20.5   18.7 -

 HC:     240.1  mm     G. Age:  26w 1d       51  %    HC/AC:      1.10   1.04 -

 AC:       218  mm     G. Age:  26w 2d       67  %    FL/BPD:     74.8   71 - 87
 FL:      49.2  mm     G. Age:  26w 4d       71  %    FL/AC:      22.6   20 - 24
 HUM:     39.4  mm     G. Age:  24w 0d       11  %
 Est. FW:     927  gm      2 lb 1 oz     72  %
Gestational Age

 LMP:           25w 3d        Date:  04/30/14                 EDD:   02/04/15
 U/S Today:     26w 3d                                        EDD:   01/28/15
 Best:          25w 3d     Det. By:  LMP  (04/30/14)          EDD:   02/04/15
Anatomy

 Cranium:          Appears normal         Aortic Arch:      Appears normal
 Fetal Cavum:      Appears normal         Ductal Arch:      Appears normal
 Ventricles:       Appears normal         Diaphragm:        Appears normal
 Choroid Plexus:   Previously seen        Stomach:          Appears normal, left
                                                            sided
 Cerebellum:       Previously seen        Abdomen:          Appears normal
 Posterior Fossa:  Previously seen        Abdominal Wall:   Appears nml (cord
                                                            insert, abd wall)
 Nuchal Fold:      Previously seen        Cord Vessels:     Appears normal (3
                                                            vessel cord)
 Face:             Orbits and profile     Kidneys:          Appear normal
                   previously seen
 Lips:             Previously seen        Bladder:          Appears normal
 Heart:            Appears normal         Spine:            Appears normal
                   (4CH, axis, and
                   situs)
 RVOT:             Appears normal         Lower             Appears normal
                                          Extremities:
 LVOT:             Appears normal         Upper             Appears normal
                                          Extremities:

 Other:  Fetus appears to be a female. Heels and 5th digit visualized. Nasal
         bone visualized.
Cervix Uterus Adnexa

 Cervical Length:    3.4      cm

 Cervix:       Normal appearance by transabdominal scan.
 Left Ovary:    Size(cm) L: 2.04 x W: 2.04 x H: 0.95  Volume(cc):
 Right Ovary:   Not visualized. No adnexal mass visualized.
Impression

 Single IUP at 25w 3d (remote read)
 EFW 72nd%'le
 No dymorphic features on interval anatomic survey
 Posterior placenta without previa
 The is a 4 x 1.5 cm irregular placental mass that is again
 seen - no vascular flow on Color Doppler
 Normal amniotic fluid volume
Recommendations

 Interval growth is recommended in 4-6 weeks (please call to
 schedule if desired by our unit).

## 2024-08-06 ENCOUNTER — Emergency Department (HOSPITAL_COMMUNITY)
Admission: EM | Admit: 2024-08-06 | Discharge: 2024-08-06 | Disposition: A | Discharge status: Home / Self Care | Payer: Self-pay

## 2024-08-06 ENCOUNTER — Other Ambulatory Visit: Payer: Self-pay

## 2024-08-06 DIAGNOSIS — R946 Abnormal results of thyroid function studies: Secondary | ICD-10-CM | POA: Insufficient documentation

## 2024-08-06 DIAGNOSIS — R7989 Other specified abnormal findings of blood chemistry: Secondary | ICD-10-CM

## 2024-08-06 DIAGNOSIS — R002 Palpitations: Secondary | ICD-10-CM | POA: Insufficient documentation

## 2024-08-06 LAB — CBC
HCT: 45.4 % (ref 36.0–46.0)
Hemoglobin: 15.8 g/dL — ABNORMAL HIGH (ref 12.0–15.0)
MCH: 31.8 pg (ref 26.0–34.0)
MCHC: 34.8 g/dL (ref 30.0–36.0)
MCV: 91.3 fL (ref 80.0–100.0)
Platelets: 345 K/uL (ref 150–400)
RBC: 4.97 MIL/uL (ref 3.87–5.11)
RDW: 12.4 % (ref 11.5–15.5)
WBC: 8.5 K/uL (ref 4.0–10.5)
nRBC: 0 % (ref 0.0–0.2)

## 2024-08-06 LAB — COMPREHENSIVE METABOLIC PANEL WITH GFR
ALT: 16 U/L (ref 0–44)
AST: 18 U/L (ref 15–41)
Albumin: 4.5 g/dL (ref 3.5–5.0)
Alkaline Phosphatase: 45 U/L (ref 38–126)
Anion gap: 9 (ref 5–15)
BUN: 6 mg/dL (ref 6–20)
CO2: 23 mmol/L (ref 22–32)
Calcium: 9.2 mg/dL (ref 8.9–10.3)
Chloride: 105 mmol/L (ref 98–111)
Creatinine, Ser: 0.77 mg/dL (ref 0.44–1.00)
GFR, Estimated: >60 mL/min (ref >60)
Glucose, Bld: 97 mg/dL (ref 70–99)
Potassium: 3.7 mmol/L (ref 3.5–5.1)
Sodium: 137 mmol/L (ref 135–145)
Total Bilirubin: 0.8 mg/dL (ref 0.0–1.2)
Total Protein: 7.5 g/dL (ref 6.5–8.1)

## 2024-08-06 LAB — T4, FREE: Free T4: 1.25 ng/dL — ABNORMAL HIGH (ref 0.61–1.12)

## 2024-08-06 LAB — TSH: TSH: 0.256 u[IU]/mL — ABNORMAL LOW (ref 0.350–4.500)

## 2024-08-06 LAB — HCG, SERUM, QUALITATIVE: Preg, Serum: NEGATIVE

## 2024-08-06 MED ORDER — HYDROXYZINE HCL 25 MG PO TABS: 25.0000 mg | ORAL_TABLET | Freq: Four times a day (QID) | ORAL | 0 refills | Status: AC

## 2024-08-06 MED ORDER — HYDROXYZINE HCL 10 MG PO TABS
10.0000 mg | ORAL_TABLET | Freq: Once | ORAL | Status: AC
  Administered 2024-08-06: 10 mg via ORAL
  Filled 2024-08-06 (×2): qty 1

## 2024-08-06 NOTE — Discharge Instructions (Addendum)
 Your workup today has been quite reassuring.  I would like you to follow-up with your primary care provider and I have given you the information for the [facility] clinic but does not require insurance.  I would also like you to follow-up with an OB/GYN I will give you the information for an office triage number that should be able to arrange for this.  I have also given you the information for the behavioral health urgent care where you can see a psychiatrist.  I suspect that you have some anxiety however I suspect that you also have some issues with your thyroid  which may be contributing to your palpitations and anxiety.  Take hydroxyzine  as prescribed as needed for anxiety, palpitations.  You may always return the emergency room for any new or concerning symptoms.

## 2024-08-06 NOTE — ED Triage Notes (Signed)
 Patient reports hot flashes with anxiety for 2 weeks .

## 2024-08-06 NOTE — ED Provider Notes (Signed)
 [place] [facility] AT [facility] Provider Note   CSN: [id number] Arrival date & time: 08/06/24  9377     Patient presents with: Hot Flashes / Anxiety   Natasha Gomez is a 33 y.o. female.   HPI  Patient is a 33 year old English-speaking female with no pertinent past medical history  She takes no medications other than vitamins which include magnesium  citrate for sleep and ashwagandha.   Patient states that for the past 2 weeks she has been having hot flashes and feeling anxious. She states she'Gomez having symptoms daily -- multiple times a day.        Prior to Admission medications   Medication Sig Start Date End Date Taking? Authorizing Provider  hydrOXYzine  (ATARAX ) 25 MG tablet Take 1 tablet (25 mg total) by mouth every 6 (six) hours for 14 days. 08/06/24 08/20/24 Yes Natasha Jerrett S, PA  ibuprofen  (ADVIL ,MOTRIN ) 600 MG tablet Take 1 tablet (600 mg total) by mouth every 6 (six) hours as needed. 02/01/15   Natasha Gomez, CNM  norethindrone  (ORTHO MICRONOR ) 0.35 MG tablet Take 1 tablet (0.35 mg total) by mouth daily. Take 1 tab by mouth daily beginning on 02/25/15. 02/25/15   Natasha Gomez, CNM  Prenatal Vit-Fe Fumarate-FA (PRENATAL MULTIVITAMIN) TABS tablet Take 1 tablet by mouth daily at 12 noon.    [provider]    Allergies: Patient has no known allergies.    Review of Systems  Updated Vital Signs BP 128/88   Pulse 67   Temp 99 F (37.2 C)   Resp 18   SpO2 100%   Physical Exam Vitals and nursing note reviewed.  Constitutional:      General: She is not in acute distress. HENT:     Head: Normocephalic and atraumatic.     Nose: Nose normal.     Mouth/Throat:     Mouth: Mucous membranes are moist.  Eyes:     General: No scleral icterus. Cardiovascular:     Rate and Rhythm: Normal rate and regular rhythm.     Pulses: Normal pulses.     Heart sounds: Normal heart sounds.     Comments: Normal rate Pulmonary:     Effort:  Pulmonary effort is normal. No respiratory distress.     Breath sounds: No wheezing.  Abdominal:     Palpations: Abdomen is soft.     Tenderness: There is no abdominal tenderness. There is no guarding or rebound.  Musculoskeletal:     Cervical back: Normal range of motion.     Right lower leg: No edema.     Left lower leg: No edema.     Comments: No calf swelling, no calf tenderness  Skin:    General: Skin is warm and dry.     Capillary Refill: Capillary refill takes less than 2 seconds.  Neurological:     Mental Status: She is alert. Mental status is at baseline.  Psychiatric:        Mood and Affect: Mood normal.        Behavior: Behavior normal.     (all labs ordered are listed, but only abnormal results are displayed) Labs Reviewed  CBC - Abnormal; Notable for the following components:      Result Value   Hemoglobin 15.8 (*)    All other components within normal limits  TSH - Abnormal; Notable for the following components:   TSH 0.256 (*)    All other components within normal limits  COMPREHENSIVE METABOLIC  PANEL WITH GFR  HCG, SERUM, QUALITATIVE  T4, FREE  T3    EKG: EKG Interpretation Date/Time:  Saturday August 06 2024 07:49:28 EST Ventricular Rate:  65 PR Interval:  163 QRS Duration:  66 QT Interval:  396 QTC Calculation: 412 R Axis:   79  Text Interpretation: Sinus rhythm RSR' in V1 or V2, probably normal variant Confirmed by Simon Rea [phone]) on 08/06/2024 7:55:21 AM  Radiology: No results found.   Procedures   Medications Ordered in the ED  hydrOXYzine  (ATARAX ) tablet 10 mg (10 mg Oral Given 08/06/24 0742)                                    Medical Decision Making Amount and/or Complexity of Data Reviewed Labs: ordered. ECG/medicine tests: ordered.  Risk Prescription drug management.   Patient is a 33 year old English-speaking female with no pertinent past medical history  She takes no medications other than vitamins which include  magnesium  citrate for sleep and ashwagandha.   Patient states that for the past 2 weeks she has been having hot flashes and feeling anxious. She states she'Gomez having symptoms daily -- multiple times a day.   She denies any chest pain.  She states during the episode she felt some shortness of breath and has bilateral hand tingling and sweating.  No fevers or chills.  No urinary frequency urgency dysuria or hematuria.   Patient is also concerned that this may be related to her 28-year-old Mirena IUD.  Because her symptoms started 2 weeks ago I have a lower suspicion for this.  I do think it is reasonable for her to follow-up with an OB/GYN however and discuss future contraceptive plans.  Obtain CMP, CBC, TSH, serum hCG and will obtain ECG.  TSH slightly low, free T4 and T3 were obtained here.  Will follow-up with primary care for further evaluation and workup.  Patient is clinically not in thyroid  storm or thyrotoxicosis.  This may be multifactorial anxiety but certainly there is a psychiatric component.  Will prescribe hydroxyzine   Final diagnoses:  Palpitations  Low TSH level    ED Discharge Orders          Ordered    hydrOXYzine  (ATARAX ) 25 MG tablet  Every 6 hours        08/06/24 1025               Natasha Gomez, [place] 08/06/24 1025    Simon Rea SAILOR, MD 08/06/24 1142
